# Patient Record
Sex: Female | Born: 1950 | Race: White | Hispanic: No | Smoking: Current every day smoker
Health system: Southern US, Community
[De-identification: ages and names within clinical notes are randomized; demographics above are authoritative.]

## PROBLEM LIST (undated history)

## (undated) DIAGNOSIS — E785 Hyperlipidemia, unspecified: Secondary | ICD-10-CM

## (undated) DIAGNOSIS — M1990 Unspecified osteoarthritis, unspecified site: Secondary | ICD-10-CM

## (undated) DIAGNOSIS — A0472 Enterocolitis due to Clostridium difficile, not specified as recurrent: Secondary | ICD-10-CM

## (undated) DIAGNOSIS — Z8719 Personal history of other diseases of the digestive system: Secondary | ICD-10-CM

## (undated) DIAGNOSIS — K5790 Diverticulosis of intestine, part unspecified, without perforation or abscess without bleeding: Secondary | ICD-10-CM

## (undated) DIAGNOSIS — I639 Cerebral infarction, unspecified: Secondary | ICD-10-CM

## (undated) DIAGNOSIS — E0590 Thyrotoxicosis, unspecified without thyrotoxic crisis or storm: Secondary | ICD-10-CM

## (undated) DIAGNOSIS — I10 Essential (primary) hypertension: Secondary | ICD-10-CM

## (undated) DIAGNOSIS — K635 Polyp of colon: Secondary | ICD-10-CM

## (undated) HISTORY — DX: Cerebral infarction, unspecified: I63.9

## (undated) HISTORY — DX: Thyrotoxicosis, unspecified without thyrotoxic crisis or storm: E05.90

## (undated) HISTORY — DX: Unspecified osteoarthritis, unspecified site: M19.90

## (undated) HISTORY — DX: Personal history of other diseases of the digestive system: Z87.19

## (undated) HISTORY — DX: Diverticulosis of intestine, part unspecified, without perforation or abscess without bleeding: K57.90

## (undated) HISTORY — DX: Enterocolitis due to Clostridium difficile, not specified as recurrent: A04.72

## (undated) HISTORY — DX: Essential (primary) hypertension: I10

## (undated) HISTORY — PX: PARTIAL HYSTERECTOMY: SHX80

## (undated) HISTORY — DX: Hyperlipidemia, unspecified: E78.5

## (undated) HISTORY — DX: Polyp of colon: K63.5

---

## 1998-11-05 ENCOUNTER — Encounter: Payer: Self-pay | Admitting: Family Medicine

## 1998-11-05 ENCOUNTER — Ambulatory Visit (HOSPITAL_COMMUNITY): Admission: RE | Admit: 1998-11-05 | Discharge: 1998-11-05 | Payer: Self-pay | Admitting: Family Medicine

## 2000-04-24 ENCOUNTER — Encounter: Payer: Self-pay | Admitting: Emergency Medicine

## 2000-04-24 ENCOUNTER — Emergency Department (HOSPITAL_COMMUNITY): Admission: EM | Admit: 2000-04-24 | Discharge: 2000-04-24 | Payer: Self-pay | Admitting: Emergency Medicine

## 2000-04-30 ENCOUNTER — Ambulatory Visit (HOSPITAL_COMMUNITY): Admission: RE | Admit: 2000-04-30 | Discharge: 2000-04-30 | Payer: Self-pay | Admitting: Family Medicine

## 2000-04-30 ENCOUNTER — Encounter: Payer: Self-pay | Admitting: Family Medicine

## 2000-07-19 ENCOUNTER — Ambulatory Visit (HOSPITAL_COMMUNITY): Admission: RE | Admit: 2000-07-19 | Discharge: 2000-07-19 | Payer: Self-pay | Admitting: Family Medicine

## 2000-07-19 ENCOUNTER — Encounter: Payer: Self-pay | Admitting: Family Medicine

## 2000-08-01 ENCOUNTER — Ambulatory Visit (HOSPITAL_COMMUNITY): Admission: RE | Admit: 2000-08-01 | Discharge: 2000-08-01 | Payer: Self-pay | Admitting: Family Medicine

## 2000-08-01 ENCOUNTER — Encounter: Payer: Self-pay | Admitting: Family Medicine

## 2001-12-07 ENCOUNTER — Ambulatory Visit (HOSPITAL_BASED_OUTPATIENT_CLINIC_OR_DEPARTMENT_OTHER): Admission: RE | Admit: 2001-12-07 | Discharge: 2001-12-07 | Payer: Self-pay | Admitting: Orthopedic Surgery

## 2003-08-06 ENCOUNTER — Ambulatory Visit (HOSPITAL_COMMUNITY): Admission: RE | Admit: 2003-08-06 | Discharge: 2003-08-06 | Payer: Self-pay | Admitting: Podiatry

## 2004-10-14 ENCOUNTER — Ambulatory Visit (HOSPITAL_COMMUNITY): Admission: RE | Admit: 2004-10-14 | Discharge: 2004-10-14 | Payer: Self-pay | Admitting: Orthopedic Surgery

## 2004-10-14 ENCOUNTER — Ambulatory Visit (HOSPITAL_BASED_OUTPATIENT_CLINIC_OR_DEPARTMENT_OTHER): Admission: RE | Admit: 2004-10-14 | Discharge: 2004-10-14 | Payer: Self-pay | Admitting: Orthopedic Surgery

## 2006-10-26 ENCOUNTER — Ambulatory Visit (HOSPITAL_COMMUNITY): Admission: RE | Admit: 2006-10-26 | Discharge: 2006-10-26 | Payer: Self-pay | Admitting: Family Medicine

## 2006-11-01 ENCOUNTER — Other Ambulatory Visit: Admission: RE | Admit: 2006-11-01 | Discharge: 2006-11-01 | Payer: Self-pay | Admitting: Family Medicine

## 2006-11-08 ENCOUNTER — Encounter: Admission: RE | Admit: 2006-11-08 | Discharge: 2006-11-08 | Payer: Self-pay | Admitting: Family Medicine

## 2008-03-07 ENCOUNTER — Ambulatory Visit (HOSPITAL_COMMUNITY): Admission: RE | Admit: 2008-03-07 | Discharge: 2008-03-07 | Payer: Self-pay | Admitting: Gastroenterology

## 2008-07-24 ENCOUNTER — Other Ambulatory Visit: Admission: RE | Admit: 2008-07-24 | Discharge: 2008-07-24 | Payer: Self-pay | Admitting: Family Medicine

## 2009-03-15 ENCOUNTER — Encounter: Admission: RE | Admit: 2009-03-15 | Discharge: 2009-03-15 | Payer: Self-pay | Admitting: Family Medicine

## 2009-04-02 ENCOUNTER — Encounter: Admission: RE | Admit: 2009-04-02 | Discharge: 2009-04-02 | Payer: Self-pay | Admitting: Family Medicine

## 2009-10-28 ENCOUNTER — Encounter: Admission: RE | Admit: 2009-10-28 | Discharge: 2009-10-28 | Payer: Self-pay | Admitting: Family Medicine

## 2009-12-31 ENCOUNTER — Encounter: Admission: RE | Admit: 2009-12-31 | Discharge: 2009-12-31 | Payer: Self-pay | Admitting: Family Medicine

## 2010-08-11 ENCOUNTER — Encounter: Payer: Self-pay | Admitting: Gastroenterology

## 2010-08-14 ENCOUNTER — Other Ambulatory Visit: Payer: Self-pay | Admitting: Neurological Surgery

## 2010-08-14 DIAGNOSIS — M542 Cervicalgia: Secondary | ICD-10-CM

## 2010-08-14 DIAGNOSIS — M545 Low back pain: Secondary | ICD-10-CM

## 2010-10-07 ENCOUNTER — Other Ambulatory Visit: Payer: Self-pay | Admitting: Family Medicine

## 2010-10-07 ENCOUNTER — Other Ambulatory Visit (HOSPITAL_COMMUNITY)
Admission: RE | Admit: 2010-10-07 | Discharge: 2010-10-07 | Disposition: A | Discharge status: Home / Self Care | Payer: Federal, State, Local not specified - PPO | Source: Ambulatory Visit | Attending: Family Medicine | Admitting: Family Medicine

## 2010-10-07 DIAGNOSIS — Z0000 Encounter for general adult medical examination without abnormal findings: Secondary | ICD-10-CM | POA: Insufficient documentation

## 2010-10-28 ENCOUNTER — Other Ambulatory Visit: Payer: Self-pay | Admitting: Neurological Surgery

## 2010-10-28 ENCOUNTER — Ambulatory Visit
Admission: RE | Admit: 2010-10-28 | Discharge: 2010-10-28 | Disposition: A | Discharge status: Home / Self Care | Payer: Federal, State, Local not specified - PPO | Source: Ambulatory Visit | Attending: Neurological Surgery | Admitting: Neurological Surgery

## 2010-10-28 DIAGNOSIS — M542 Cervicalgia: Secondary | ICD-10-CM

## 2010-10-28 DIAGNOSIS — M545 Low back pain: Secondary | ICD-10-CM

## 2010-11-05 ENCOUNTER — Other Ambulatory Visit: Payer: Self-pay | Admitting: Obstetrics and Gynecology

## 2010-11-05 DIAGNOSIS — Z1231 Encounter for screening mammogram for malignant neoplasm of breast: Secondary | ICD-10-CM

## 2010-11-12 ENCOUNTER — Ambulatory Visit
Admission: RE | Admit: 2010-11-12 | Discharge: 2010-11-12 | Disposition: A | Discharge status: Home / Self Care | Payer: Federal, State, Local not specified - PPO | Source: Ambulatory Visit | Attending: Obstetrics and Gynecology | Admitting: Obstetrics and Gynecology

## 2010-11-12 DIAGNOSIS — Z1231 Encounter for screening mammogram for malignant neoplasm of breast: Secondary | ICD-10-CM

## 2010-12-05 NOTE — Op Note (Signed)
NAME:  Claudia May, Claudia May                   ACCOUNT NO.:  [id number]   MEDICAL RECORD NO.:  [id number]          PATIENT TYPE:  AMB   LOCATION:  DSC                          FACILITY:  [facility]   PHYSICIAN:  Doralee Albino. Carola Frost, M.D. DATE OF BIRTH:  02/29/1951   DATE OF PROCEDURE:  DATE OF DISCHARGE:                                 OPERATIVE REPORT   REFERRING PHYSICIAN:  Prescilla Sours, M.D.   PREOPERATIVE DIAGNOSIS:  Left Weber B ankle fracture.   POSTOPERATIVE DIAGNOSIS:  Left Weber B ankle fracture.   PROCEDURE:  Open reduction and internal fixation of left distal fibula.   SURGEON:  Doralee Albino. Carola Frost, M.D.   ASSISTANT:  Cecil Cranker, PA.   ANESTHESIA:  General anesthesia.   TOURNIQUET:  None.   COMPLICATIONS:  None.   ESTIMATED BLOOD LOSS:  Less than 10 mL.   DISPOSITION:  To PACU.   CONDITION:  Stable.   BRIEF SUMMARY OF INDICATION FOR PROCEDURE:  Claudia May is a 60 year old white  female who sustained a left ankle fracture after a fall from missing a last  step going down the stairs.  She denied any sensory or motor disturbances.  X-rays demonstrated a displaced distal fibula fracture.  After discussion of  risks and benefits of surgery, the patient wished to proceed with definitive  internal fixation.  She understood these risks include infection, nerve  injury, vessel injury, malunion, nonunion, arthritis, possible need for  further surgery.   DESCRIPTION OF PROCEDURE:  Claudia May was taken to the operating room after  administration of preoperative antibiotics, where general anesthesia was  induced.  Her left lower extremity was prepped and draped in the usual  sterile fashion.  No tourniquet was used.  A standard 6 cm incision was  centered over the distal fibula and dissection carried carefully down  through the proximal portion of the wound to avoid any injury to the  superficial peroneal nerve which was not encountered.  There was  considerable hematoma and  internal damage around the fibula fracture.  Fortunately, the periosteal layer for the most part remained intact along  the edges of the fibula fracture, however, there remained no anterior distal  ligamentous attachments to the distal fibula.  Posterior ligaments did  remain intact and were visualized within the joint.  There were no loose  bodies or evidence of chondral injury along the head of the talus as  visualized from the wound.  Patient did have some bone loss of secondary  comminution along the anterior edge of the distal fragment.  The fracture  site was exposed and periosteum teased back for a millimeter or two with the  15 blade after debridement of the hematoma with lavage and curette.  The  fracture site was then reduced with a lobster claw and held while two  anterior to posterior lag screws were placed using the 3.5 drill to  overdrill the near cortex, top hat device and then the 2.5 drill to complete  placement.  These then enabled removal of the clamp.  Once again, three  views  of the fibula confirmed anatomic reduction.  Consequently, a six-hole  plate was selected and then placed in neutralization fashion along the  lateral aspect of the fibula using fully threaded 4.0 cancellous screws for  the two distal holes and otherwise all 3.5 cortical screws.  Following  fixation of the fibula an external rotation stress view was performed to  look for syndesmotic widening or widening of the medial clear space.  Under  aggressive stress examination, there was no widening detected.  Consequently, no syndesmotic fixation was required.  Wound was copiously  irrigated and closed in standard layer fashion with 2-0 Vicryl and staples  for the skin.  Posterior and U splint was applied with the patient's ankle  at 90 degrees.  She is awakened from anesthesia and transported to the PACU  in stable condition.   PROGNOSIS:  Claudia May prognosis with this fracture is good because of the   stable syndesmosis and the absence of visible chondral injury.  Fortunately  able to obtain an apparently anatomic reduction in spite of the comminution  and this should help her ankle to recover biomechanically.  Because of the  extensive ligamentous injury along the anterior distal fibula, we will be  more conservative with her range of motion and early weightbearing  postoperatively.  We anticipate four weeks of mobilization and then  initiation of motion and weightbearing in probably the six-week mark or so.      MHH/MEDQ  D:  10/14/2004  T:  10/14/2004  Job:  [id number]   cc:   Prescilla Sours, M.D.

## 2010-12-05 NOTE — Op Note (Signed)
[facility]  Patient:    Claudia May, Claudia May Visit Number: [id number] MRN: [id number]          Service Type: DSU Location: [facility] Attending Physician:  Marlowe Shores Dictated by:   Artist Pais Mina Marble, M.D. Proc. Date: 12/07/01 Admit Date:  12/07/2001                             Operative Report  PREOPERATIVE DIAGNOSIS:  Right carpal tunnel syndrome.  POSTOPERATIVE DIAGNOSIS:  Right carpal tunnel syndrome.  PROCEDURE:  Right carpal tunnel release.  SURGEON:  Artist Pais. Mina Marble, M.D.  ASSISTANT:  Junius Roads. Ireton, P.A.C.  ANESTHESIA:  General.  TOURNIQUET TIME:  Ten minutes.  COMPLICATIONS:  No complications.  DRAINS:  No drains.  DESCRIPTION OF PROCEDURE:  The patient was taken to the operating room and after the induction of adequate general anesthesia, her right upper extremity was prepped and draped in the usual sterile fashion.  An Esmarch was used to exsanguinate the limb and tourniquet was inflated to 250 mmHg.  At this point in time, a 2-cm incision was made in the palmar aspect of the right hand in line with the long finger metacarpal, starting at Kaplans cardinal line.  The incision was taken down through the skin and subcutaneous tissue, until the palmar fascia was identified.  The palmar fascia was split longitudinally, the superficial palmar arch was retracted distally and the distal edge of the transverse carpal ligament was identified.  It was split with a 15 blade, exposing the underlying median nerve and contents of the carpal canal.  Next, using a Therapist, nutritional, a path was created dorso-volar to the remaining aspect of the ligament and this was then divided under direct vision with a pair of blunt scissors.  The nerve was then inspected.  No osseous lesions or ganglion were present in the canal.  It was then irrigated and closed with running 3-0 Prolene in subcuticular stitch.  Steri-Strips, 4 x 4s, fluffs  and compressive dressing were applied.  The patient tolerated the procedure well and went to recovery room in stable fashion. Dictated by:   Artist Pais Mina Marble, M.D. Attending Physician:  Marlowe Shores DD:  12/07/01 TD:  12/08/01 Job: [id number]

## 2012-06-27 ENCOUNTER — Other Ambulatory Visit: Payer: Self-pay | Admitting: Neurological Surgery

## 2012-08-23 ENCOUNTER — Other Ambulatory Visit: Payer: Self-pay | Admitting: Neurological Surgery

## 2013-11-24 ENCOUNTER — Other Ambulatory Visit: Payer: Self-pay

## 2013-11-24 DIAGNOSIS — Z1231 Encounter for screening mammogram for malignant neoplasm of breast: Secondary | ICD-10-CM

## 2013-11-27 ENCOUNTER — Ambulatory Visit
Admission: RE | Admit: 2013-11-27 | Discharge: 2013-11-27 | Disposition: A | Discharge status: Home / Self Care | Payer: Federal, State, Local not specified - PPO | Source: Ambulatory Visit

## 2013-11-27 DIAGNOSIS — Z1231 Encounter for screening mammogram for malignant neoplasm of breast: Secondary | ICD-10-CM

## 2013-11-29 ENCOUNTER — Other Ambulatory Visit: Payer: Self-pay | Admitting: Family Medicine

## 2013-11-29 DIAGNOSIS — R928 Other abnormal and inconclusive findings on diagnostic imaging of breast: Secondary | ICD-10-CM

## 2013-12-22 ENCOUNTER — Ambulatory Visit
Admission: RE | Admit: 2013-12-22 | Discharge: 2013-12-22 | Disposition: A | Discharge status: Home / Self Care | Payer: Federal, State, Local not specified - PPO | Source: Ambulatory Visit | Attending: Family Medicine | Admitting: Family Medicine

## 2013-12-22 ENCOUNTER — Encounter (INDEPENDENT_AMBULATORY_CARE_PROVIDER_SITE_OTHER): Payer: Self-pay

## 2013-12-22 DIAGNOSIS — R928 Other abnormal and inconclusive findings on diagnostic imaging of breast: Secondary | ICD-10-CM

## 2015-01-16 ENCOUNTER — Other Ambulatory Visit: Payer: Self-pay

## 2015-01-16 DIAGNOSIS — Z1231 Encounter for screening mammogram for malignant neoplasm of breast: Secondary | ICD-10-CM

## 2015-01-24 ENCOUNTER — Ambulatory Visit
Admission: RE | Admit: 2015-01-24 | Discharge: 2015-01-24 | Disposition: A | Discharge status: Home / Self Care | Payer: Federal, State, Local not specified - PPO | Source: Ambulatory Visit

## 2015-01-24 DIAGNOSIS — Z1231 Encounter for screening mammogram for malignant neoplasm of breast: Secondary | ICD-10-CM

## 2015-10-03 ENCOUNTER — Other Ambulatory Visit: Payer: Self-pay | Admitting: Physical Medicine and Rehabilitation

## 2015-10-03 DIAGNOSIS — R102 Pelvic and perineal pain: Secondary | ICD-10-CM

## 2015-10-05 ENCOUNTER — Ambulatory Visit
Admission: RE | Admit: 2015-10-05 | Discharge: 2015-10-05 | Disposition: A | Discharge status: Home / Self Care | Payer: Federal, State, Local not specified - PPO | Source: Ambulatory Visit | Attending: Physical Medicine and Rehabilitation | Admitting: Physical Medicine and Rehabilitation

## 2015-10-05 DIAGNOSIS — R102 Pelvic and perineal pain: Secondary | ICD-10-CM

## 2016-01-24 ENCOUNTER — Other Ambulatory Visit: Payer: Self-pay | Admitting: Family Medicine

## 2016-01-24 DIAGNOSIS — Z1231 Encounter for screening mammogram for malignant neoplasm of breast: Secondary | ICD-10-CM

## 2016-01-28 ENCOUNTER — Ambulatory Visit: Payer: Federal, State, Local not specified - PPO

## 2016-01-28 ENCOUNTER — Ambulatory Visit
Admission: RE | Admit: 2016-01-28 | Discharge: 2016-01-28 | Disposition: A | Discharge status: Home / Self Care | Payer: Federal, State, Local not specified - PPO | Source: Ambulatory Visit | Attending: Family Medicine | Admitting: Family Medicine

## 2016-01-28 DIAGNOSIS — Z1231 Encounter for screening mammogram for malignant neoplasm of breast: Secondary | ICD-10-CM

## 2016-01-30 ENCOUNTER — Other Ambulatory Visit: Payer: Self-pay | Admitting: Family Medicine

## 2016-01-30 DIAGNOSIS — R928 Other abnormal and inconclusive findings on diagnostic imaging of breast: Secondary | ICD-10-CM

## 2016-02-05 ENCOUNTER — Ambulatory Visit
Admission: RE | Admit: 2016-02-05 | Discharge: 2016-02-05 | Disposition: A | Discharge status: Home / Self Care | Payer: Federal, State, Local not specified - PPO | Source: Ambulatory Visit | Attending: Family Medicine | Admitting: Family Medicine

## 2016-02-05 DIAGNOSIS — R928 Other abnormal and inconclusive findings on diagnostic imaging of breast: Secondary | ICD-10-CM

## 2016-07-06 ENCOUNTER — Other Ambulatory Visit: Payer: Self-pay | Admitting: Family Medicine

## 2016-07-06 DIAGNOSIS — N6489 Other specified disorders of breast: Secondary | ICD-10-CM

## 2017-02-05 ENCOUNTER — Ambulatory Visit
Admission: RE | Admit: 2017-02-05 | Discharge: 2017-02-05 | Disposition: A | Discharge status: Home / Self Care | Payer: Federal, State, Local not specified - PPO | Source: Ambulatory Visit | Attending: Family Medicine | Admitting: Family Medicine

## 2017-02-05 ENCOUNTER — Other Ambulatory Visit: Payer: Self-pay | Admitting: Family Medicine

## 2017-02-05 ENCOUNTER — Ambulatory Visit: Payer: Federal, State, Local not specified - PPO

## 2017-02-05 DIAGNOSIS — N6489 Other specified disorders of breast: Secondary | ICD-10-CM

## 2017-10-01 ENCOUNTER — Other Ambulatory Visit: Payer: Self-pay | Admitting: Family Medicine

## 2017-10-01 DIAGNOSIS — N63 Unspecified lump in unspecified breast: Secondary | ICD-10-CM

## 2017-10-08 ENCOUNTER — Ambulatory Visit
Admission: RE | Admit: 2017-10-08 | Discharge: 2017-10-08 | Disposition: A | Discharge status: Home / Self Care | Payer: Federal, State, Local not specified - PPO | Source: Ambulatory Visit | Attending: Family Medicine | Admitting: Family Medicine

## 2017-10-08 DIAGNOSIS — N63 Unspecified lump in unspecified breast: Secondary | ICD-10-CM

## 2018-02-01 ENCOUNTER — Other Ambulatory Visit: Payer: Self-pay | Admitting: Family Medicine

## 2018-02-01 DIAGNOSIS — Z1231 Encounter for screening mammogram for malignant neoplasm of breast: Secondary | ICD-10-CM

## 2018-02-18 ENCOUNTER — Ambulatory Visit
Admission: RE | Admit: 2018-02-18 | Discharge: 2018-02-18 | Disposition: A | Discharge status: Home / Self Care | Payer: Federal, State, Local not specified - PPO | Source: Ambulatory Visit | Attending: Family Medicine | Admitting: Family Medicine

## 2018-02-18 DIAGNOSIS — Z1231 Encounter for screening mammogram for malignant neoplasm of breast: Secondary | ICD-10-CM

## 2019-01-16 ENCOUNTER — Other Ambulatory Visit: Payer: Self-pay | Admitting: Family Medicine

## 2019-01-16 DIAGNOSIS — Z1231 Encounter for screening mammogram for malignant neoplasm of breast: Secondary | ICD-10-CM

## 2019-02-27 ENCOUNTER — Ambulatory Visit
Admission: RE | Admit: 2019-02-27 | Discharge: 2019-02-27 | Disposition: A | Discharge status: Home / Self Care | Payer: Federal, State, Local not specified - PPO | Source: Ambulatory Visit | Attending: Family Medicine | Admitting: Family Medicine

## 2019-02-27 ENCOUNTER — Other Ambulatory Visit: Payer: Self-pay

## 2019-02-27 DIAGNOSIS — Z1231 Encounter for screening mammogram for malignant neoplasm of breast: Secondary | ICD-10-CM

## 2019-03-20 ENCOUNTER — Other Ambulatory Visit: Payer: Self-pay | Admitting: Family Medicine

## 2019-03-20 DIAGNOSIS — N83202 Unspecified ovarian cyst, left side: Secondary | ICD-10-CM

## 2019-03-20 DIAGNOSIS — R52 Pain, unspecified: Secondary | ICD-10-CM

## 2019-03-21 ENCOUNTER — Other Ambulatory Visit: Payer: Self-pay | Admitting: Family Medicine

## 2019-03-21 DIAGNOSIS — E2839 Other primary ovarian failure: Secondary | ICD-10-CM

## 2019-03-23 ENCOUNTER — Ambulatory Visit
Admission: RE | Admit: 2019-03-23 | Discharge: 2019-03-23 | Disposition: A | Discharge status: Home / Self Care | Payer: Federal, State, Local not specified - PPO | Source: Ambulatory Visit | Attending: Family Medicine | Admitting: Family Medicine

## 2019-03-23 DIAGNOSIS — R52 Pain, unspecified: Secondary | ICD-10-CM

## 2019-03-23 DIAGNOSIS — N83202 Unspecified ovarian cyst, left side: Secondary | ICD-10-CM

## 2019-04-06 ENCOUNTER — Other Ambulatory Visit: Payer: Self-pay

## 2019-04-06 ENCOUNTER — Ambulatory Visit
Admission: RE | Admit: 2019-04-06 | Discharge: 2019-04-06 | Disposition: A | Discharge status: Home / Self Care | Payer: Federal, State, Local not specified - PPO | Source: Ambulatory Visit | Attending: Family Medicine | Admitting: Family Medicine

## 2019-04-06 DIAGNOSIS — E2839 Other primary ovarian failure: Secondary | ICD-10-CM

## 2019-04-18 ENCOUNTER — Ambulatory Visit
Admission: RE | Admit: 2019-04-18 | Discharge: 2019-04-18 | Disposition: A | Discharge status: Home / Self Care | Payer: Federal, State, Local not specified - PPO | Source: Ambulatory Visit | Attending: Family Medicine | Admitting: Family Medicine

## 2019-04-18 ENCOUNTER — Other Ambulatory Visit: Payer: Self-pay | Admitting: Family Medicine

## 2019-04-18 DIAGNOSIS — R1030 Lower abdominal pain, unspecified: Secondary | ICD-10-CM

## 2019-04-18 MED ORDER — IOPAMIDOL (ISOVUE-300) INJECTION 61%
100.0000 mL | Freq: Once | INTRAVENOUS | Status: AC | PRN
  Administered 2019-04-18: 100 mL via INTRAVENOUS

## 2019-04-21 ENCOUNTER — Other Ambulatory Visit: Payer: Self-pay | Admitting: Family Medicine

## 2019-04-21 DIAGNOSIS — R1030 Lower abdominal pain, unspecified: Secondary | ICD-10-CM
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
## 2019-09-29 ENCOUNTER — Other Ambulatory Visit: Payer: Self-pay | Admitting: Gastroenterology

## 2019-10-21 ENCOUNTER — Other Ambulatory Visit (HOSPITAL_COMMUNITY)
Admission: RE | Admit: 2019-10-21 | Discharge: 2019-10-21 | Disposition: A | Discharge status: Home / Self Care | Payer: Federal, State, Local not specified - PPO | Source: Ambulatory Visit | Attending: Gastroenterology | Admitting: Gastroenterology

## 2019-10-21 DIAGNOSIS — Z01812 Encounter for preprocedural laboratory examination: Secondary | ICD-10-CM | POA: Insufficient documentation

## 2019-10-21 DIAGNOSIS — Z20822 Contact with and (suspected) exposure to covid-19: Secondary | ICD-10-CM | POA: Insufficient documentation

## 2019-10-21 LAB — SARS CORONAVIRUS 2 (TAT 6-24 HRS): SARS Coronavirus 2: NEGATIVE

## 2019-10-25 ENCOUNTER — Ambulatory Visit (HOSPITAL_COMMUNITY)
Admission: RE | Admit: 2019-10-25 | Discharge: 2019-10-25 | Disposition: A | Discharge status: Home / Self Care | Payer: Federal, State, Local not specified - PPO | Attending: Gastroenterology | Admitting: Gastroenterology

## 2019-10-25 ENCOUNTER — Encounter (HOSPITAL_COMMUNITY)
Admission: RE | Disposition: A | Discharge status: Home / Self Care | Payer: Self-pay | Source: Home / Self Care | Attending: Gastroenterology

## 2019-10-25 DIAGNOSIS — R1310 Dysphagia, unspecified: Secondary | ICD-10-CM | POA: Diagnosis not present

## 2019-10-25 HISTORY — PX: ESOPHAGEAL MANOMETRY: SHX5429

## 2019-10-25 HISTORY — PX: PH IMPEDANCE STUDY: SHX5565

## 2019-10-25 SURGERY — MANOMETRY, ESOPHAGUS

## 2019-10-25 MED ORDER — LIDOCAINE VISCOUS HCL 2 % MT SOLN
OROMUCOSAL | Status: AC
  Filled 2019-10-25: qty 15

## 2019-10-25 SURGICAL SUPPLY — 2 items
FACESHIELD LNG OPTICON STERILE (SAFETY) IMPLANT
GLOVE BIO SURGEON STRL SZ8 (GLOVE) ×6 IMPLANT

## 2019-10-25 NOTE — Progress Notes (Signed)
Esophageal Manometry done per protocol. Pt tolerated well without distress or complication. Ph probe placed per protocol. Pt tolerated well. Instructed using teach back on study and monitor. Pt voiced understanding. Pt will return to have probe removed at 0915 or after tomorrow and to have monitor downloaded.

## 2020-01-24 ENCOUNTER — Other Ambulatory Visit: Payer: Self-pay | Admitting: Family Medicine

## 2020-01-24 DIAGNOSIS — Z1231 Encounter for screening mammogram for malignant neoplasm of breast: Secondary | ICD-10-CM

## 2020-02-29 ENCOUNTER — Ambulatory Visit
Admission: RE | Admit: 2020-02-29 | Discharge: 2020-02-29 | Disposition: A | Discharge status: Home / Self Care | Payer: Federal, State, Local not specified - PPO | Source: Ambulatory Visit | Attending: Family Medicine | Admitting: Family Medicine

## 2020-02-29 ENCOUNTER — Other Ambulatory Visit: Payer: Self-pay

## 2020-02-29 DIAGNOSIS — Z1231 Encounter for screening mammogram for malignant neoplasm of breast: Secondary | ICD-10-CM

## 2020-04-10 ENCOUNTER — Encounter: Payer: Self-pay | Admitting: Internal Medicine

## 2020-05-30 ENCOUNTER — Encounter: Payer: Self-pay | Admitting: Internal Medicine

## 2020-05-30 ENCOUNTER — Ambulatory Visit: Payer: Federal, State, Local not specified - PPO | Admitting: Internal Medicine

## 2020-05-30 VITALS — BP 124/90 | HR 80 | Ht 60.5 in | Wt 151.4 lb

## 2020-05-30 DIAGNOSIS — K219 Gastro-esophageal reflux disease without esophagitis: Secondary | ICD-10-CM

## 2020-05-30 DIAGNOSIS — R1310 Dysphagia, unspecified: Secondary | ICD-10-CM

## 2020-05-30 NOTE — Patient Instructions (Signed)
You have been scheduled for an endoscopy. Please follow written instructions given to you at your visit today. If you use inhalers (even only as needed), please bring them with you on the day of your procedure.   

## 2020-05-30 NOTE — Progress Notes (Signed)
HISTORY OF PRESENT ILLNESS:  Claudia May is a 69 y.o. female, new to this practice, who was referred by her primary care provider regarding dysphagia.  The patient has had her prior GI care provided by [facility].  She tells me that she has had several colonoscopies.  Her last one was 3 years ago.  She was told to follow-up in 10 years.  She tells me that she had problems with dysphagia 30 years ago.  Underwent endoscopy with esophageal dilation, and was better.  She was seen earlier this year by Dr. Bosie Clos regarding dysphagia.  Initially the patient told me that she had intermittent solid food dysphagia.  Then she told me she has dysphagia to pills.  Also sensation with thick phlegm.  In any event, she tells me that upper endoscopy earlier this year was unremarkable.  She was not dilated.  She was placed on PPI (told she had GERD) but did not improve.  Subsequently underwent esophageal manometry which was normal.  She also underwent pH and impedance testing.  Noted to have minimal reflux with a normal DeMeester score.  Being dissatisfied, she is on second opinion.  Patient is no longer on PPI.  She does have occasional reflux symptoms.  No weight loss.  No lower GI complaints.  Outside records have been requested.  She has completed her Covid vaccination series.  REVIEW OF SYSTEMS:  All non-GI ROS negative unless otherwise stated in HPI except for arthritis, heart rhythm change, hearing problems, sinus and allergies  Past Medical History:  Diagnosis Date  . Arthritis   . C. difficile diarrhea   . Colon polyps   . Diverticulosis   . HLD (hyperlipidemia)   . HTN (hypertension)   . Hyperthyroidism   . Status post dilation of esophageal narrowing   . Stroke [facility])     Past Surgical History:  Procedure Laterality Date  . ESOPHAGEAL MANOMETRY N/A 10/25/2019   Procedure: ESOPHAGEAL MANOMETRY (EM);  Surgeon: Charlott Rakes, MD;  Location: [facility]OSCOPY;  Service: Endoscopy;  Laterality: N/A;  .  PARTIAL HYSTERECTOMY     have overies  . PH IMPEDANCE STUDY N/A 10/25/2019   Procedure: PH IMPEDANCE STUDY;  Surgeon: Charlott Rakes, MD;  Location: [facility] ENDOSCOPY;  Service: Endoscopy;  Laterality: N/A;    Social History Claudia May  reports that she has been smoking cigarettes. She has never used smokeless tobacco. She reports that she does not drink alcohol and does not use drugs.  family history includes Allergic rhinitis in her father; Bone cancer in her paternal grandfather; Breast cancer in her sister; COPD in her sister; Colon polyps in her sister and sister; Congestive Heart Failure in her sister; Crohn's disease in her sister; Diabetes in her paternal grandmother and sister; Emphysema in her brother, brother, father, and sister; Heart disease in her sister; Hypertension in her father; Other in her sister.  No Known Allergies     PHYSICAL EXAMINATION: Vital signs: BP 124/90 (BP Location: Left Arm, Patient Position: Sitting, Cuff Size: Normal)   Pulse 80   Ht 5' 0.5" (1.537 m) Comment: height measured without shoes  Wt 151 lb 6 oz (68.7 kg)   BMI 29.08 kg/m   Constitutional: generally well-appearing, no acute distress Psychiatric: alert and oriented x3, cooperative Eyes: extraocular movements intact, anicteric, conjunctiva pink Mouth: oral pharynx moist, no lesions Neck: supple no lymphadenopathy Cardiovascular: heart regular rate and rhythm, no murmur Lungs: clear to auscultation bilaterally Abdomen: soft, nontender, nondistended, no obvious ascites,  no peritoneal signs, normal bowel sounds, no organomegaly Rectal: Omitted Extremities: no clubbing, cyanosis, or lower extremity edema bilaterally Skin: no lesions on visible extremities Neuro: No focal deficits.  Cranial nerves intact  ASSESSMENT:  1.  Pill dysphagia in the mid chest.  Rule out subtle ring or stricture not appreciated on endoscopy.  Rule out extrinsic esophageal compression 2.  Reports colonoscopy 3 years  ago with follow-up recommended in 10 years (at age 10)   PLAN:  1.  Schedule upper endoscopy with esophageal dilation.The nature of the procedure, as well as the risks, benefits, and alternatives were carefully and thoroughly reviewed with the patient. Ample time for discussion and questions allowed. The patient understood, was satisfied, and agreed to proceed. 2.  If upper endoscopy with esophageal dilation does not provide relief of dysphagia, then would proceed to barium swallow with tablet to exclude extrinsic compression on the esophagus. 3.  Obtain outside records for review (requested)

## 2020-06-11 ENCOUNTER — Encounter: Payer: Federal, State, Local not specified - PPO | Admitting: Internal Medicine

## 2020-07-01 ENCOUNTER — Other Ambulatory Visit: Payer: Self-pay | Admitting: Physician Assistant

## 2020-07-01 DIAGNOSIS — N6310 Unspecified lump in the right breast, unspecified quadrant: Secondary | ICD-10-CM

## 2020-08-07 ENCOUNTER — Ambulatory Visit
Admission: RE | Admit: 2020-08-07 | Discharge: 2020-08-07 | Disposition: A | Discharge status: Home / Self Care | Payer: Federal, State, Local not specified - PPO | Source: Ambulatory Visit | Attending: Physician Assistant | Admitting: Physician Assistant

## 2020-08-07 ENCOUNTER — Other Ambulatory Visit: Payer: Self-pay

## 2020-08-07 DIAGNOSIS — N6310 Unspecified lump in the right breast, unspecified quadrant: Secondary | ICD-10-CM

## 2020-08-08 ENCOUNTER — Encounter: Payer: Federal, State, Local not specified - PPO | Admitting: Internal Medicine

## 2020-08-09 ENCOUNTER — Other Ambulatory Visit: Payer: Federal, State, Local not specified - PPO

## 2021-01-22 ENCOUNTER — Other Ambulatory Visit: Payer: Self-pay | Admitting: Family Medicine

## 2021-01-22 DIAGNOSIS — Z1231 Encounter for screening mammogram for malignant neoplasm of breast: Secondary | ICD-10-CM

## 2021-03-18 ENCOUNTER — Other Ambulatory Visit: Payer: Self-pay

## 2021-03-18 ENCOUNTER — Ambulatory Visit
Admission: RE | Admit: 2021-03-18 | Discharge: 2021-03-18 | Disposition: A | Discharge status: Home / Self Care | Payer: Federal, State, Local not specified - PPO | Source: Ambulatory Visit | Attending: Family Medicine | Admitting: Family Medicine

## 2021-03-18 DIAGNOSIS — Z1231 Encounter for screening mammogram for malignant neoplasm of breast: Secondary | ICD-10-CM
